# Patient Record
Sex: Female | Born: 2000 | Race: White | Hispanic: No | Marital: Single | Smoking: Former smoker
Health system: Southern US, Community
[De-identification: ages and names within clinical notes are randomized; demographics above are authoritative.]

## PROBLEM LIST (undated history)

## (undated) DIAGNOSIS — T7840XA Allergy, unspecified, initial encounter: Secondary | ICD-10-CM

## (undated) DIAGNOSIS — F32A Depression, unspecified: Secondary | ICD-10-CM

## (undated) DIAGNOSIS — F329 Major depressive disorder, single episode, unspecified: Secondary | ICD-10-CM

## (undated) DIAGNOSIS — F913 Oppositional defiant disorder: Secondary | ICD-10-CM

## (undated) DIAGNOSIS — F419 Anxiety disorder, unspecified: Secondary | ICD-10-CM

## (undated) HISTORY — DX: Depression, unspecified: F32.A

## (undated) HISTORY — DX: Oppositional defiant disorder: F91.3

---
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
